# Patient Record
Sex: Male | Born: 2000 | Race: White | Hispanic: No | Marital: Single | Smoking: Never smoker
Health system: Southern US, Community
[De-identification: ages and names within clinical notes are randomized; demographics above are authoritative.]

## PROBLEM LIST (undated history)

## (undated) ENCOUNTER — Emergency Department (HOSPITAL_COMMUNITY): Admission: EM | Payer: Medicaid Other | Source: Home / Self Care

---
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
## 2021-06-01 ENCOUNTER — Other Ambulatory Visit: Payer: Self-pay

## 2021-06-01 ENCOUNTER — Encounter (HOSPITAL_COMMUNITY): Payer: Self-pay | Admitting: *Deleted

## 2021-06-01 ENCOUNTER — Emergency Department (HOSPITAL_COMMUNITY)
Admission: EM | Admit: 2021-06-01 | Discharge: 2021-06-01 | Disposition: A | Discharge status: Home / Self Care | Payer: Medicaid Other | Attending: Emergency Medicine | Admitting: Emergency Medicine

## 2021-06-01 DIAGNOSIS — Z20822 Contact with and (suspected) exposure to covid-19: Secondary | ICD-10-CM | POA: Diagnosis not present

## 2021-06-01 DIAGNOSIS — J101 Influenza due to other identified influenza virus with other respiratory manifestations: Secondary | ICD-10-CM | POA: Diagnosis not present

## 2021-06-01 DIAGNOSIS — R509 Fever, unspecified: Secondary | ICD-10-CM | POA: Diagnosis present

## 2021-06-01 DIAGNOSIS — J111 Influenza due to unidentified influenza virus with other respiratory manifestations: Secondary | ICD-10-CM

## 2021-06-01 LAB — RESP PANEL BY RT-PCR (FLU A&B, COVID) ARPGX2
Influenza A by PCR: POSITIVE — AB
Influenza B by PCR: NEGATIVE
SARS Coronavirus 2 by RT PCR: NEGATIVE

## 2021-06-01 MED ORDER — BENZONATATE 100 MG PO CAPS: 200.0000 mg | ORAL_CAPSULE | Freq: Three times a day (TID) | ORAL | 0 refills | Status: AC | PRN

## 2021-06-01 NOTE — Discharge Instructions (Signed)
Your flu test today was positive.  I recommend that you alternate Tylenol every 4 hours and 800 mg ibuprofen 3 times a day with food to help with fever and body aches.  Drink plenty of fluids.  Your symptoms may last from several days up to 2 weeks.  Follow-up with your primary care provider for recheck, return to the emergency department for any new or worsening symptoms.

## 2021-06-01 NOTE — ED Provider Notes (Signed)
[facility] EMERGENCY DEPARTMENT Provider Note   CSN: [id number] Arrival date & time: 06/01/21  1355     History Chief Complaint  Patient presents with   Cough    Raymond Patton is a 20 y.o. male.   Cough Associated symptoms: chills, fever, headaches, rhinorrhea and sore throat   Associated symptoms: no myalgias, no rash and no wheezing       Raymond Patton is a 20 y.o. male who presents to the Emergency Department complaining of generalized body aches, headache, cough and fever.  Symptoms have been present for 2 days.  Initially, he states he noticed a sore throat that has progressed to nasal congestion and rhinorrhea.  Body aches worse upon waking this morning.  Max fever at home unknown.  Some chest tightness associated with cough and exertion.  He does endorse having intermittent chills.  Describes frontal headache as throbbing.  No neck pain or stiffness.  He denies nausea, vomiting or diarrhea.  No abdominal pain.  No known sick contacts.   History reviewed. No pertinent past medical history.  There are no problems to display for this patient.   History reviewed. No pertinent surgical history.     History reviewed. No pertinent family history.  Social History   Tobacco Use   Smoking status: Never   Smokeless tobacco: Never  Substance Use Topics   Alcohol use: No   Drug use: Never    Home Medications Prior to Admission medications   Medication Sig Start Date End Date Taking? Authorizing Provider  benzonatate (TESSALON) 100 MG capsule Take 2 capsules (200 mg total) by mouth 3 (three) times daily as needed. 01/09/18   Burgess Amor, PA-C  predniSONE (DELTASONE) 10 MG tablet Take 6 tablets day one, 5 tablets day two, 4 tablets day three, 3 tablets day four, 2 tablets day five, then 1 tablet day six 01/10/18   Burgess Amor, PA-C    Allergies    Patient has no known allergies.  Review of Systems   Review of Systems  Constitutional:  Positive for chills and  fever.  HENT:  Positive for congestion, rhinorrhea and sore throat. Negative for trouble swallowing.   Respiratory:  Positive for cough and chest tightness. Negative for wheezing.   Gastrointestinal:  Negative for abdominal pain, diarrhea, nausea and vomiting.  Genitourinary:  Negative for dysuria.  Musculoskeletal:  Negative for myalgias, neck pain and neck stiffness.  Skin:  Negative for rash.  Neurological:  Positive for headaches. Negative for syncope, weakness and numbness.  Psychiatric/Behavioral:  Negative for confusion.    Physical Exam Updated Vital Signs BP (!) 126/91 (BP Location: Right Arm)   Pulse (!) 102   Temp 99.2 F (37.3 C) (Oral)   Resp 16   Ht 6\' 1"  (1.854 m)   Wt 111.1 kg   SpO2 98%   BMI 32.32 kg/m   Physical Exam Vitals and nursing note reviewed.  Constitutional:      General: He is not in acute distress.    Appearance: Normal appearance.  HENT:     Nose: Rhinorrhea present.     Mouth/Throat:     Mouth: Mucous membranes are moist.     Pharynx: Oropharynx is clear. No oropharyngeal exudate or posterior oropharyngeal erythema.     Comments: Uvula midline nonedematous. Cardiovascular:     Rate and Rhythm: Normal rate and regular rhythm.     Pulses: Normal pulses.  Pulmonary:     Effort: Pulmonary effort is normal.  Abdominal:  Palpations: Abdomen is soft.     Tenderness: There is no abdominal tenderness.  Musculoskeletal:        General: Normal range of motion.     Cervical back: No tenderness.  Lymphadenopathy:     Cervical: No cervical adenopathy.  Skin:    General: Skin is warm.     Capillary Refill: Capillary refill takes less than 2 seconds.     Findings: No rash.  Neurological:     General: No focal deficit present.     Mental Status: He is alert.     Sensory: No sensory deficit.     Motor: No weakness.    ED Results / Procedures / Treatments   Labs (all labs ordered are listed, but only abnormal results are displayed) Labs  Reviewed  RESP PANEL BY RT-PCR (FLU A&B, COVID) ARPGX2 - Abnormal; Notable for the following components:      Result Value   Influenza A by PCR POSITIVE (*)    All other components within normal limits    EKG None  Radiology No results found.  Procedures Procedures   Medications Ordered in ED Medications - No data to display  ED Course  I have reviewed the triage vital signs and the nursing notes.  Pertinent labs & imaging results that were available during my care of the patient were reviewed by me and considered in my medical decision making (see chart for details).    MDM Rules/Calculators/A&P                           Patient here with 2-day history of generalized body aches, sore throat, cough fever and chills.  On exam, a well-appearing nontoxic.  Low-grade fever here.  No hypoxia or tachypnea.  Mild tachycardia associated with low-grade fever.  No increased work of breathing.  Likely influenza.  Will obtain testing.  On recheck, patient resting comfortably.  No acute distress.  Influenza A positive.  Results discussed with patient.  Offered Tamiflu but patient declined stating he prefers symptomatic treatment.  Will alternate Tylenol, ibuprofen and increase fluids.  He appears appropriate for discharge.  Return precautions were discussed.  All questions answered.   Final Clinical Impression(s) / ED Diagnoses Final diagnoses:  Influenza    Rx / DC Orders ED Discharge Orders     None        Pauline Aus, PA-C 06/04/21 1549    Benjiman Core, MD 06/06/21 [phone]

## 2021-06-01 NOTE — ED Triage Notes (Signed)
Pt with cough and fever for pat 2 days.  Denies any known sick contact.
